# Patient Record
Sex: Male | Born: 1995 | Smoking: Never smoker
Health system: Southern US, Community
[De-identification: ages and names within clinical notes are randomized; demographics above are authoritative.]

---

## 2018-07-25 ENCOUNTER — Other Ambulatory Visit: Payer: Self-pay | Admitting: Gastroenterology

## 2018-07-25 DIAGNOSIS — R1033 Periumbilical pain: Secondary | ICD-10-CM

## 2018-07-28 ENCOUNTER — Ambulatory Visit
Admission: RE | Admit: 2018-07-28 | Discharge: 2018-07-28 | Disposition: A | Discharge status: Home / Self Care | Payer: BLUE CROSS/BLUE SHIELD | Source: Ambulatory Visit | Attending: Gastroenterology | Admitting: Gastroenterology

## 2018-07-28 DIAGNOSIS — R1033 Periumbilical pain: Secondary | ICD-10-CM

## 2018-07-28 MED ORDER — IOPAMIDOL (ISOVUE-300) INJECTION 61%
100.0000 mL | Freq: Once | INTRAVENOUS | Status: AC | PRN
  Administered 2018-07-28: 100 mL via INTRAVENOUS

## 2018-10-31 ENCOUNTER — Encounter (HOSPITAL_COMMUNITY): Payer: Self-pay | Admitting: Emergency Medicine

## 2018-10-31 ENCOUNTER — Emergency Department (HOSPITAL_COMMUNITY)
Admission: EM | Admit: 2018-10-31 | Discharge: 2018-10-31 | Disposition: A | Discharge status: Home / Self Care | Payer: BLUE CROSS/BLUE SHIELD | Attending: Emergency Medicine | Admitting: Emergency Medicine

## 2018-10-31 ENCOUNTER — Other Ambulatory Visit: Payer: Self-pay

## 2018-10-31 DIAGNOSIS — Z5321 Procedure and treatment not carried out due to patient leaving prior to being seen by health care provider: Secondary | ICD-10-CM | POA: Insufficient documentation

## 2018-10-31 DIAGNOSIS — R1030 Lower abdominal pain, unspecified: Secondary | ICD-10-CM | POA: Insufficient documentation

## 2018-10-31 LAB — COMPREHENSIVE METABOLIC PANEL
ALT: 21 U/L (ref 0–44)
AST: 33 U/L (ref 15–41)
Albumin: 4.4 g/dL (ref 3.5–5.0)
Alkaline Phosphatase: 96 U/L (ref 38–126)
Anion gap: 11 (ref 5–15)
BUN: 13 mg/dL (ref 6–20)
CO2: 25 mmol/L (ref 22–32)
Calcium: 9.5 mg/dL (ref 8.9–10.3)
Chloride: 104 mmol/L (ref 98–111)
Creatinine, Ser: 0.8 mg/dL (ref 0.61–1.24)
GFR calc Af Amer: >60 mL/min (ref >60)
GFR calc non Af Amer: >60 mL/min (ref >60)
Glucose, Bld: 124 mg/dL — ABNORMAL HIGH (ref 70–99)
Potassium: 3.2 mmol/L — ABNORMAL LOW (ref 3.5–5.1)
Sodium: 140 mmol/L (ref 135–145)
TOTAL PROTEIN: 7.5 g/dL (ref 6.5–8.1)
Total Bilirubin: 1 mg/dL (ref 0.3–1.2)

## 2018-10-31 LAB — CBC
HCT: 45.5 % (ref 39.0–52.0)
Hemoglobin: 16 g/dL (ref 13.0–17.0)
MCH: 31.3 pg (ref 26.0–34.0)
MCHC: 35.2 g/dL (ref 30.0–36.0)
MCV: 88.9 fL (ref 80.0–100.0)
Platelets: 203 10*3/uL (ref 150–400)
RBC: 5.12 MIL/uL (ref 4.22–5.81)
RDW: 11.9 % (ref 11.5–15.5)
WBC: 11.5 10*3/uL — ABNORMAL HIGH (ref 4.0–10.5)
nRBC: 0 % (ref 0.0–0.2)

## 2018-10-31 LAB — LIPASE, BLOOD: Lipase: 30 U/L (ref 11–51)

## 2018-10-31 MED ORDER — SODIUM CHLORIDE 0.9% FLUSH: 3.0000 mL | Freq: Once | INTRAVENOUS | Status: DC

## 2018-10-31 NOTE — ED Triage Notes (Signed)
C/o mid lower and LLQ pain since 5pm with nausea.  Reports problems with intermittent abd pain since 2018 without finding out diagnosis.

## 2018-10-31 NOTE — ED Notes (Signed)
Pt states pain is going away and wants to leave.  Explained triage process and encouraged pt to stay and he declines.

## 2018-11-01 ENCOUNTER — Other Ambulatory Visit: Payer: Self-pay | Admitting: Gastroenterology

## 2018-11-01 ENCOUNTER — Ambulatory Visit
Admission: RE | Admit: 2018-11-01 | Discharge: 2018-11-01 | Disposition: A | Discharge status: Home / Self Care | Payer: Self-pay | Source: Ambulatory Visit | Attending: Gastroenterology | Admitting: Gastroenterology

## 2018-11-01 DIAGNOSIS — R109 Unspecified abdominal pain: Secondary | ICD-10-CM

## 2018-11-02 ENCOUNTER — Other Ambulatory Visit: Payer: Self-pay

## 2020-06-20 IMAGING — CT CT ABD-PELV W/ CM
1 of 2 series · 14 of 32 positions shown, 19 images · IV contrast (APPLIED)
Comparison: None.

CLINICAL DATA: Paraumbilical and left lower quadrant pain for
approximately 1 year. Vomiting.

EXAM:
CT ABDOMEN AND PELVIS WITH CONTRAST
TECHNIQUE: Multidetector CT imaging of the abdomen and pelvis was performed
using the standard protocol following bolus administration of
intravenous contrast.
CONTRAST:  100mL AJCQIY-3BB IOPAMIDOL (AJCQIY-3BB) INJECTION 61%

[Series 2: abd/pelvis w/cm · axial · 0.71mm/px · z∈[-425,-50]mm · 14 of 85 slices shown, 19 images]
[im 5/85  soft-tissue]
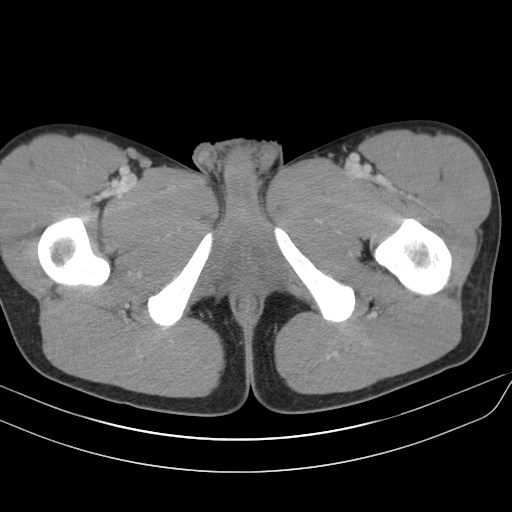
[im 5/85  bone]
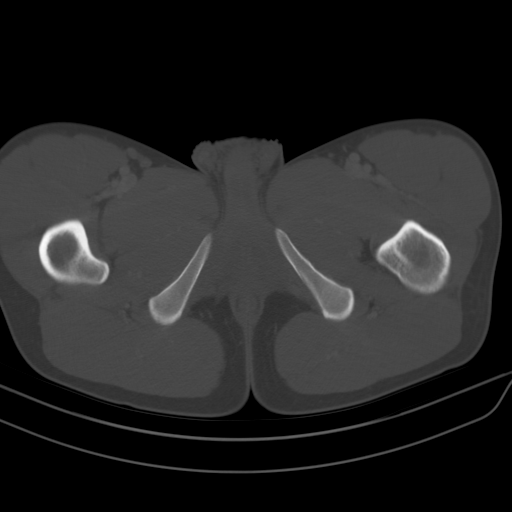
[im 13/85  soft-tissue]
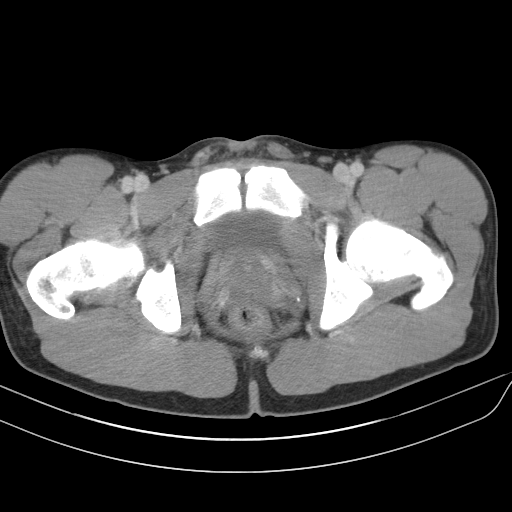
[im 17/85  soft-tissue]
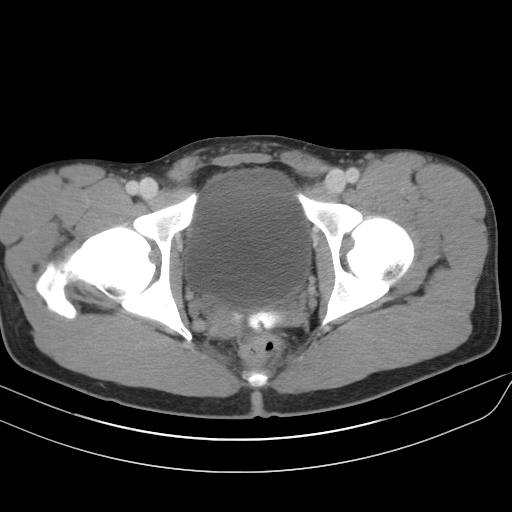
[im 26/85  soft-tissue]
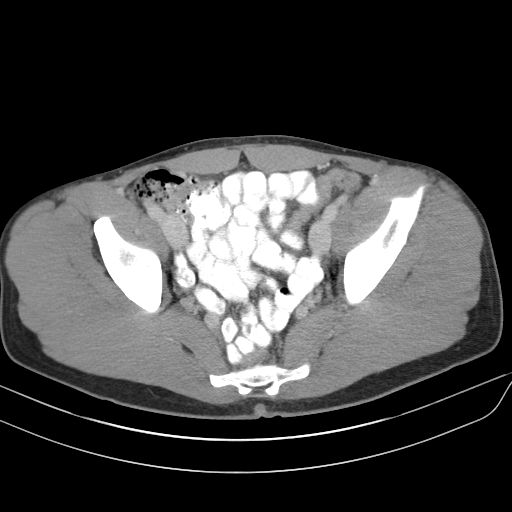
[im 30/85  soft-tissue]
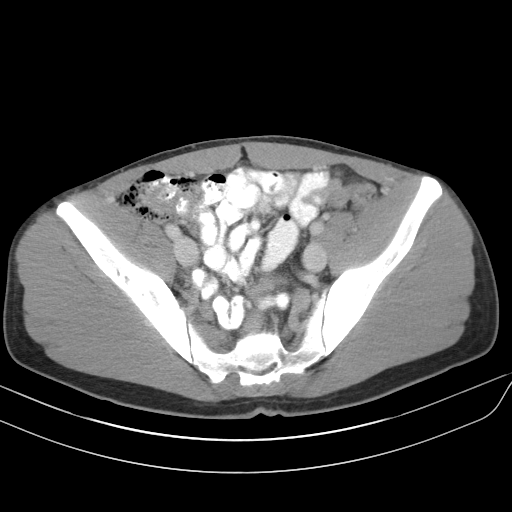
[im 38/85  soft-tissue]
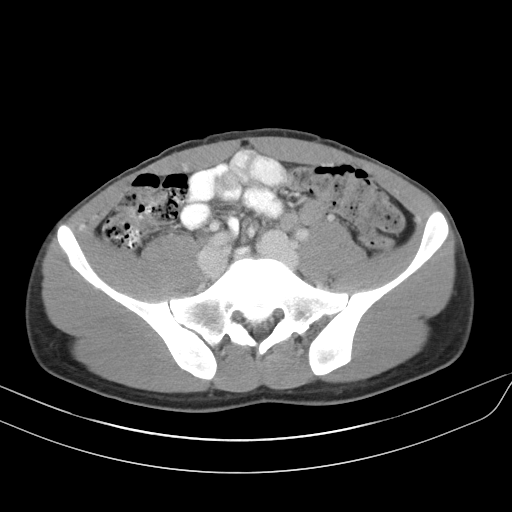
[im 43/85  soft-tissue]
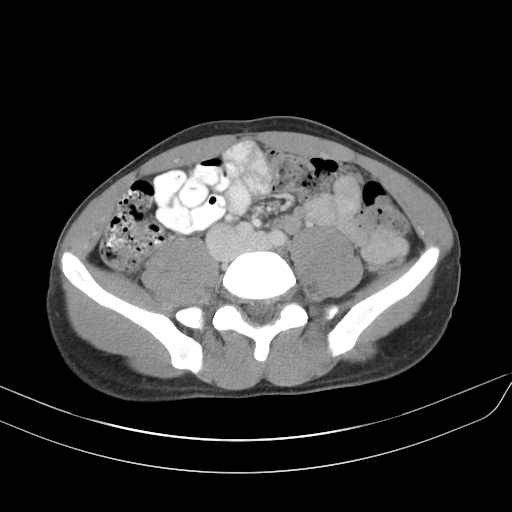
[im 47/85  soft-tissue]
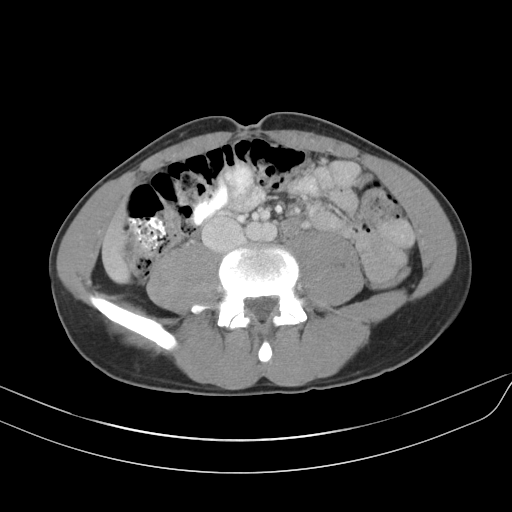
[im 55/85  soft-tissue]
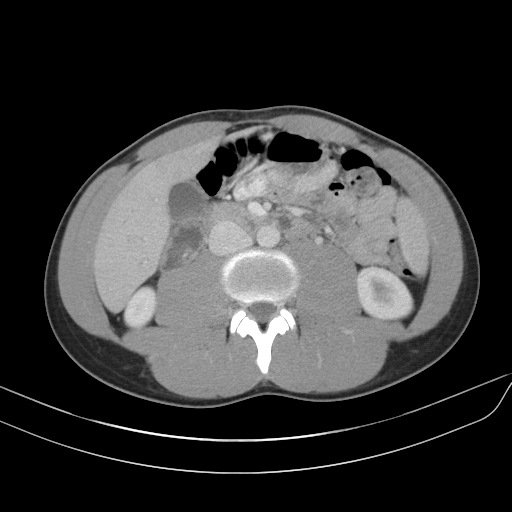
[im 55/85  bone]
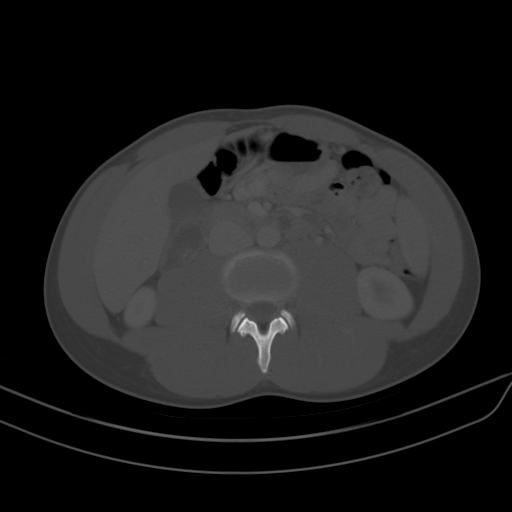
[im 59/85  soft-tissue]
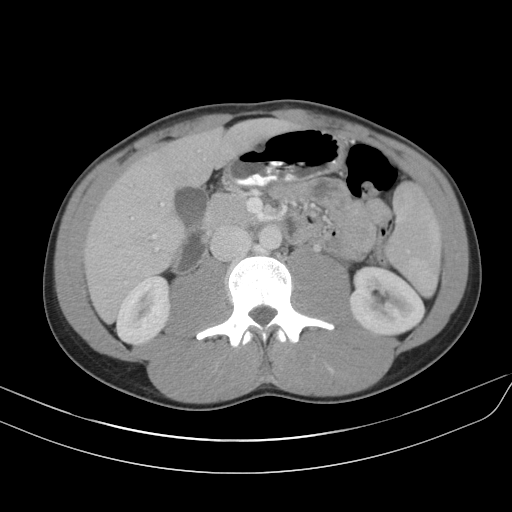
[im 68/85  soft-tissue]
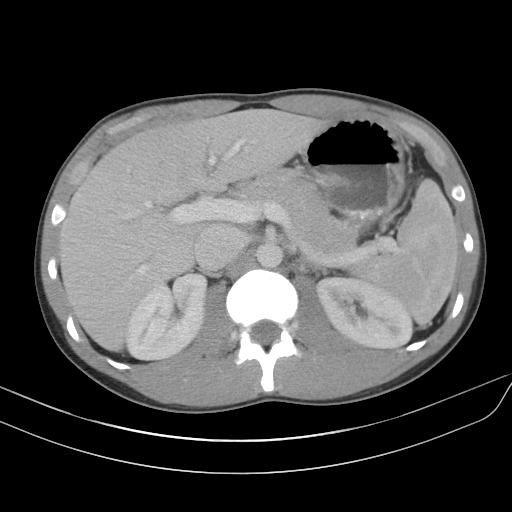
[im 68/85  lung]
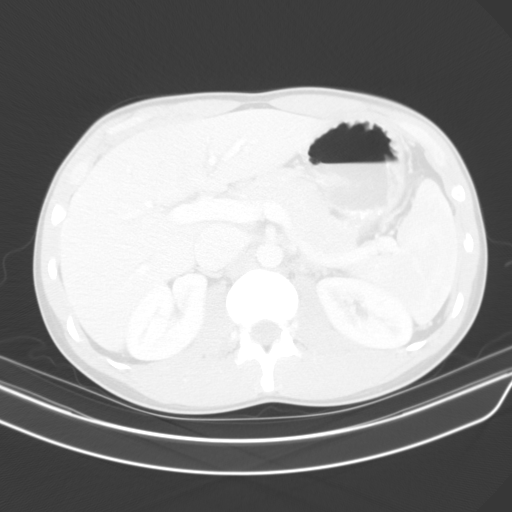
[im 72/85  soft-tissue]
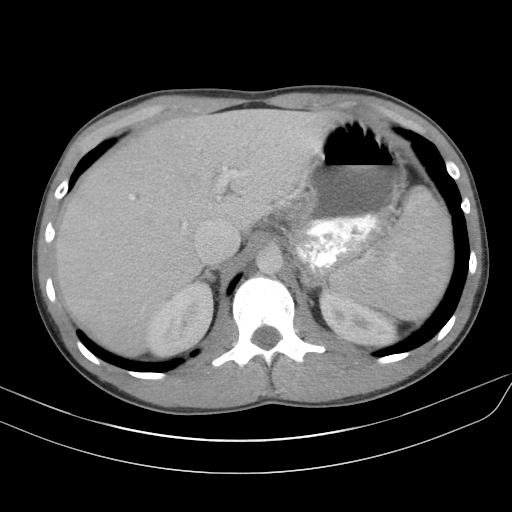
[im 72/85  lung]
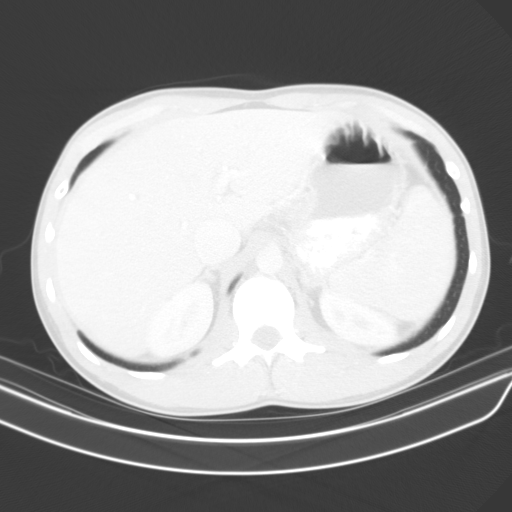
[im 76/85  lung]
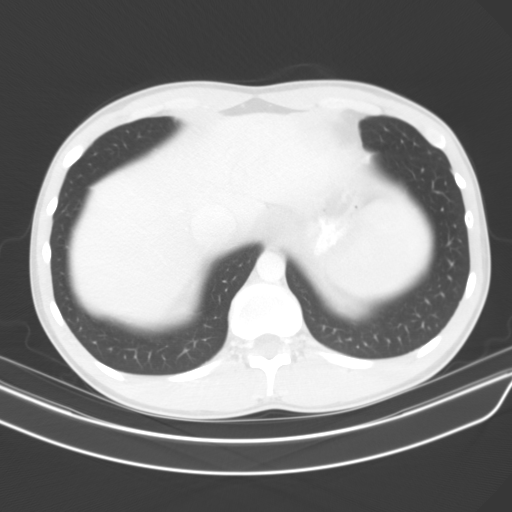
[im 80/85  soft-tissue]
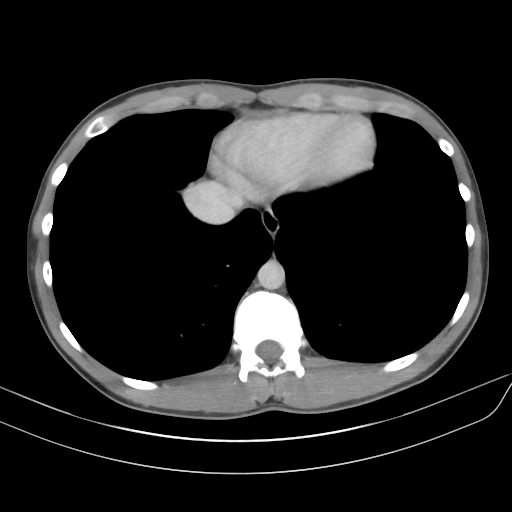
[im 80/85  lung]
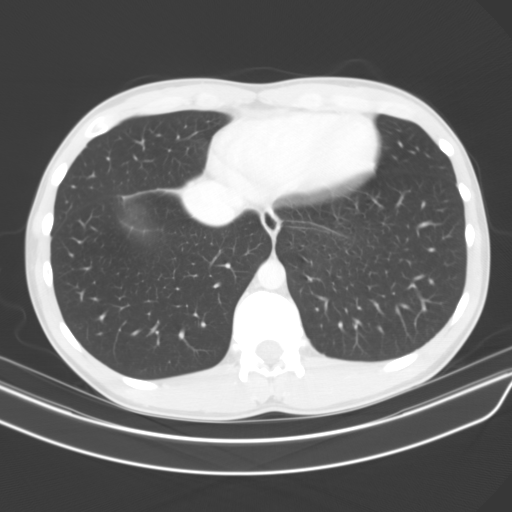

[14 of 32 positions shown; findings below may reference images not displayed]

FINDINGS: Lower Chest: No acute findings.

Hepatobiliary: No hepatic masses identified. Gallbladder is
unremarkable.

Pancreas:  No mass or inflammatory changes.

Spleen: Within normal limits in size and appearance.

Adrenals/Urinary Tract: No masses identified. No evidence of
hydronephrosis.

Stomach/Bowel: No evidence of obstruction, inflammatory process or
abnormal fluid collections.

Vascular/Lymphatic: No pathologically enlarged lymph nodes. No
abdominal aortic aneurysm.

Reproductive:  No mass or other significant abnormality.

Other:  None.

Musculoskeletal:  No suspicious bone lesions identified.
IMPRESSION: Negative.  No acute findings or other significant abnormality.

## 2020-09-24 IMAGING — CR DG ABDOMEN 1V
1 series · 1 of 1 positions shown · non-contrast
Comparison: CT abdomen pelvis dated July 28, 2018.

CLINICAL DATA: Chronic periumbilical pain since August 2017.

EXAM:
ABDOMEN - 1 VIEW

[t abdomen supine]
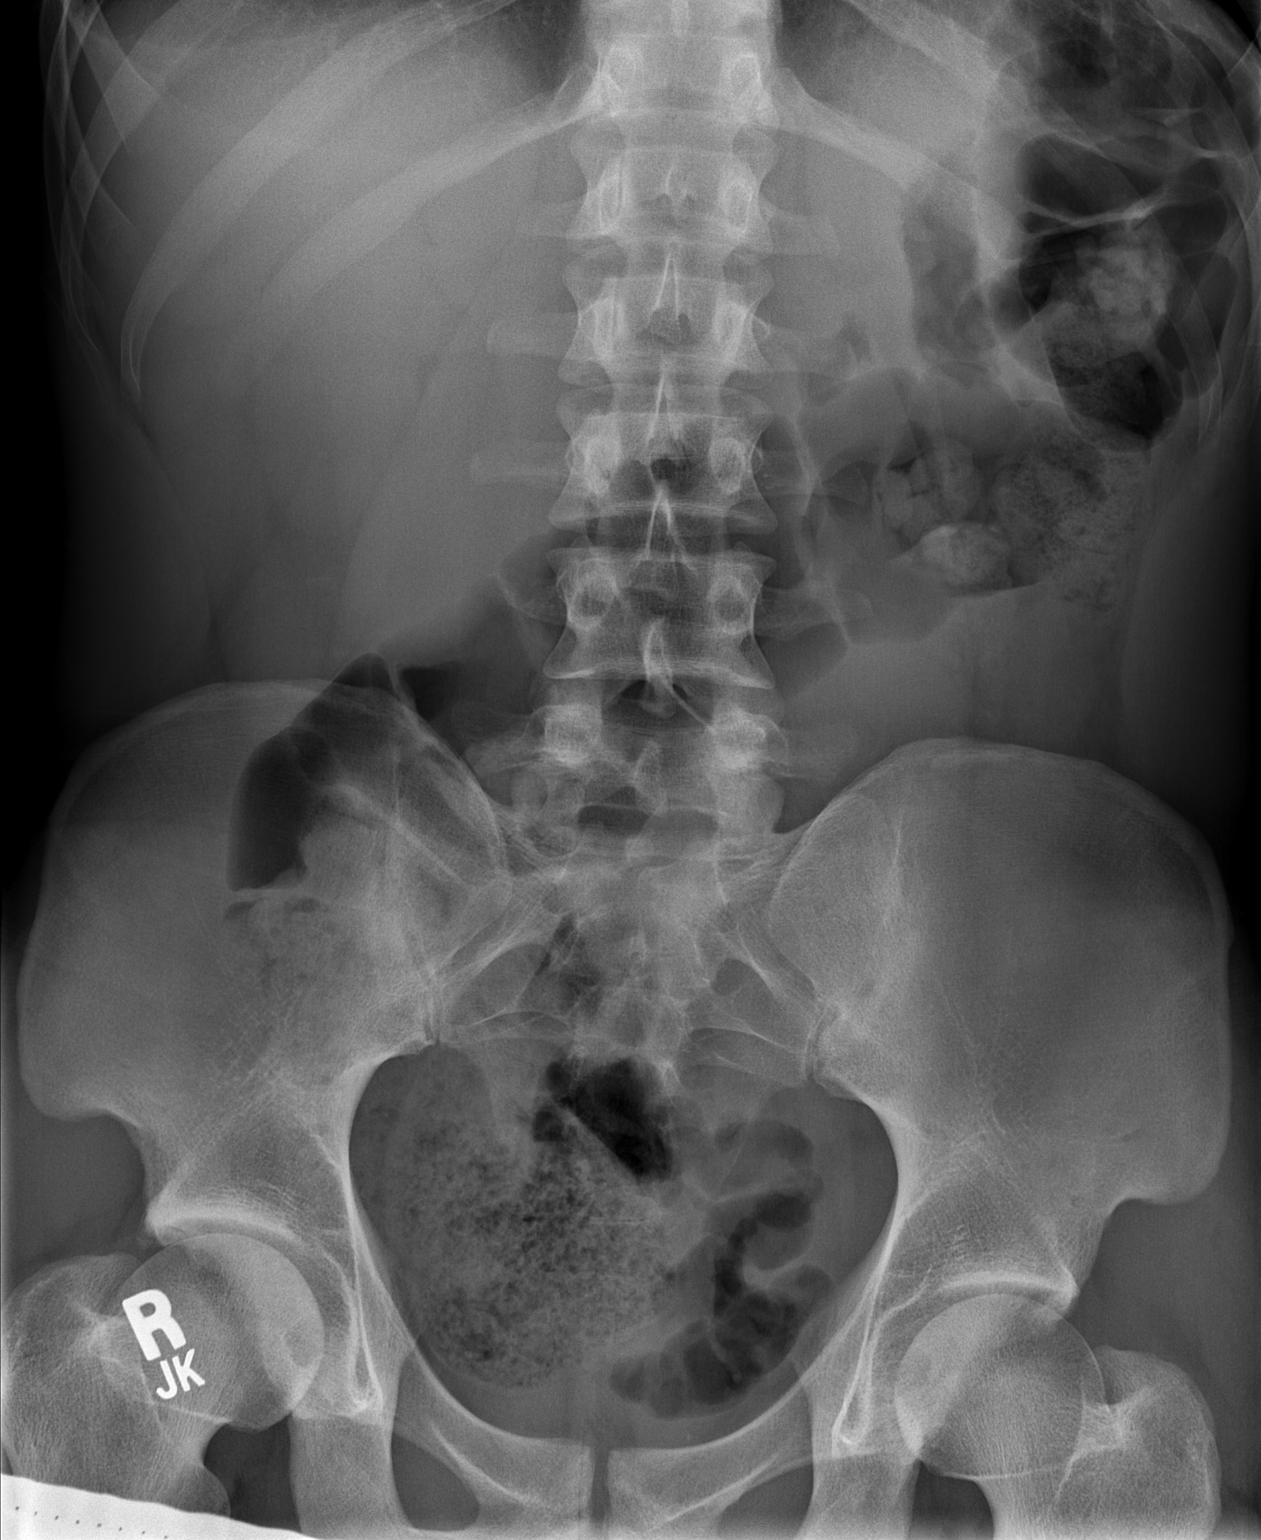

[1 of 1 positions shown; findings below may reference images not displayed]

FINDINGS: The bowel gas pattern is normal. No radio-opaque calculi or other
significant radiographic abnormality are seen. No acute osseous
abnormality.
IMPRESSION: Negative.

## 2022-12-24 ENCOUNTER — Encounter (HOSPITAL_COMMUNITY): Payer: Self-pay

## 2022-12-24 ENCOUNTER — Ambulatory Visit (HOSPITAL_COMMUNITY)
Admission: EM | Admit: 2022-12-24 | Discharge: 2022-12-24 | Disposition: A | Discharge status: Home / Self Care | Payer: Self-pay | Attending: Emergency Medicine | Admitting: Emergency Medicine

## 2022-12-24 DIAGNOSIS — Z23 Encounter for immunization: Secondary | ICD-10-CM

## 2022-12-24 DIAGNOSIS — S71131A Puncture wound without foreign body, right thigh, initial encounter: Secondary | ICD-10-CM

## 2022-12-24 DIAGNOSIS — W540XXA Bitten by dog, initial encounter: Secondary | ICD-10-CM

## 2022-12-24 MED ORDER — TETANUS-DIPHTH-ACELL PERTUSSIS 5-2.5-18.5 LF-MCG/0.5 IM SUSY
0.5000 mL | PREFILLED_SYRINGE | Freq: Once | INTRAMUSCULAR | Status: AC
  Administered 2022-12-24: 0.5 mL via INTRAMUSCULAR

## 2022-12-24 MED ORDER — TETANUS-DIPHTH-ACELL PERTUSSIS 5-2.5-18.5 LF-MCG/0.5 IM SUSY
PREFILLED_SYRINGE | INTRAMUSCULAR | Status: AC
  Filled 2022-12-24: qty 0.5

## 2022-12-24 MED ORDER — AMOXICILLIN-POT CLAVULANATE 875-125 MG PO TABS: 1.0000 | ORAL_TABLET | Freq: Two times a day (BID) | ORAL | 0 refills | Status: AC

## 2022-12-24 NOTE — Discharge Instructions (Signed)
Please see the enclosed information about animal bites.  I hope you find this helpful.  Please pick up and begin your prescription for amoxicillin clavulanate, also called Augmentin.  Please take 1 tablet twice daily for a full 5-day course.  Please monitor your wound for signs of worsening infection, return if these occur.  Thank you for visiting urgent care.

## 2022-12-24 NOTE — ED Triage Notes (Signed)
Got bit on right leg from dog. Has had shots. Hasn't taken anything fr pain.

## 2022-12-24 NOTE — ED Provider Notes (Signed)
[facility]    CSN: [id number] Arrival date & time: 12/24/22  1845    HISTORY   Chief Complaint  Patient presents with   Animal Bite   HPI Bobby Goodwin is a pleasant, 27 y.o. male who presents to urgent care today. Patient states a dog with current rabies vaccination status, known to him, bit him on his right leg today.  Patient states he has not taken anything for pain.  Patient states the area is red and swollen painful.  Patient reports being due for Tdap vaccine.    The history is provided by the patient.   History reviewed. No pertinent past medical history. There are no problems to display for this patient.  History reviewed. No pertinent surgical history.  Home Medications    Prior to Admission medications   Not on File    Family History History reviewed. No pertinent family history. Social History Social History   Tobacco Use   Smoking status: Never    Passive exposure: Never   Smokeless tobacco: Never  Vaping Use   Vaping Use: Never used  Substance Use Topics   Alcohol use: Not Currently   Drug use: Not Currently   Allergies   Patient has no allergy information on record.  Review of Systems Review of Systems Pertinent findings revealed after performing a 14 point review of systems has been noted in the history of present illness.  Physical Exam Vital Signs BP 131/83 (BP Location: Left Arm)   Pulse 66   Temp 98.2 F (36.8 C) (Oral)   Resp 18   Wt 175 lb (79.4 kg)   SpO2 95%   BMI 24.41 kg/m   No data found.  Physical Exam Vitals and nursing note reviewed.  Constitutional:      General: He is not in acute distress.    Appearance: Normal appearance. He is normal weight. He is not ill-appearing.  HENT:     Head: Normocephalic and atraumatic.  Eyes:     Extraocular Movements: Extraocular movements intact.     Conjunctiva/sclera: Conjunctivae normal.     Pupils: Pupils are equal, round, and reactive to light.   Cardiovascular:     Rate and Rhythm: Normal rate and regular rhythm.  Pulmonary:     Effort: Pulmonary effort is normal.     Breath sounds: Normal breath sounds.  Musculoskeletal:        General: Normal range of motion.     Cervical back: Normal range of motion and neck supple.  Skin:    General: Skin is warm and dry.     Findings: Lesion (Wound to lateral aspect of right thigh concerning for dog bite with 2 puncture wounds of multiple abrasions, surrounding erythema, mild induration and warm to touch.) present.  Neurological:     General: No focal deficit present.     Mental Status: He is alert and oriented to person, place, and time. Mental status is at baseline.  Psychiatric:        Mood and Affect: Mood normal.        Behavior: Behavior normal.        Thought Content: Thought content normal.        Judgment: Judgment normal.     Visual Acuity Right Eye Distance:   Left Eye Distance:   Bilateral Distance:    Right Eye Near:   Left Eye Near:    Bilateral Near:     UC Couse / Diagnostics / Procedures:  Radiology No results found.  Procedures Procedures (including critical care time) EKG  Pending results:  Labs Reviewed - No data to display  Medications Ordered in UC: Medications  Tdap (BOOSTRIX) injection 0.5 mL (has no administration in time range)    UC Diagnoses / Final Clinical Impressions(s)   I have reviewed the triage vital signs and the nursing notes.  Pertinent labs & imaging results that were available during my care of the patient were reviewed by me and considered in my medical decision making (see chart for details).    Final diagnoses:  Dog bite, initial encounter  Need for Tdap vaccination   Patient provided with Tdap vaccine.  Patient provided with a 5-day course of Augmentin for prophylaxis of wound infection.  Return precautions advised.  Please see discharge instructions below for details of plan of care as provided to patient. ED  Prescriptions     Medication Sig Dispense Auth. Provider   amoxicillin-clavulanate (AUGMENTIN) 875-125 MG tablet Take 1 tablet by mouth 2 (two) times daily for 5 days. 10 tablet Lynden Oxford Scales, PA-C      PDMP not reviewed this encounter.  Pending results:  Labs Reviewed - No data to display  Discharge Instructions:   Discharge Instructions      Please see the enclosed information about animal bites.  I hope you find this helpful.  Please pick up and begin your prescription for amoxicillin clavulanate, also called Augmentin.  Please take 1 tablet twice daily for a full 5-day course.  Please monitor your wound for signs of worsening infection, return if these occur.  Thank you for visiting urgent care.      Disposition Upon Discharge:  Condition: stable for discharge home  Patient presented with an acute illness with associated systemic symptoms and significant discomfort requiring urgent management. In my opinion, this is a condition that a prudent lay person (someone who possesses an average knowledge of health and medicine) may potentially expect to result in complications if not addressed urgently such as respiratory distress, impairment of bodily function or dysfunction of bodily organs.   Routine symptom specific, illness specific and/or disease specific instructions were discussed with the patient and/or caregiver at length.   As such, the patient has been evaluated and assessed, work-up was performed and treatment was provided in alignment with urgent care protocols and evidence based medicine.  Patient/parent/caregiver has been advised that the patient may require follow up for further testing and treatment if the symptoms continue in spite of treatment, as clinically indicated and appropriate.  Patient/parent/caregiver has been advised to return to the [facility] or PCP if no better; to PCP or the Emergency Department if new signs and symptoms develop, or if the  current signs or symptoms continue to change or worsen for further workup, evaluation and treatment as clinically indicated and appropriate  The patient will follow up with their current PCP if and as advised. If the patient does not currently have a PCP we will assist them in obtaining one.   The patient may need specialty follow up if the symptoms continue, in spite of conservative treatment and management, for further workup, evaluation, consultation and treatment as clinically indicated and appropriate.  Patient/parent/caregiver verbalized understanding and agreement of plan as discussed.  All questions were addressed during visit.  Please see discharge instructions below for further details of plan.  This office note has been dictated using Museum/gallery curator.  Unfortunately, this method of dictation can sometimes lead to typographical  or grammatical errors.  I apologize for your inconvenience in advance if this occurs.  Please do not hesitate to reach out to me if clarification is needed.      Lynden Oxford [place], [place] 12/27/22 [phone]
